# Patient Record
Sex: Female | Born: 1965 | Hispanic: No | Marital: Married | Smoking: Never smoker
Health system: Southern US, Community
[De-identification: ages and names within clinical notes are randomized; demographics above are authoritative.]

## PROBLEM LIST (undated history)

## (undated) HISTORY — PX: ABDOMINAL HYSTERECTOMY: SHX81

## (undated) HISTORY — PX: OTHER SURGICAL HISTORY: SHX169

---

## 2019-12-14 ENCOUNTER — Other Ambulatory Visit: Payer: Self-pay | Admitting: Obstetrics and Gynecology

## 2019-12-14 ENCOUNTER — Ambulatory Visit
Admission: RE | Admit: 2019-12-14 | Discharge: 2019-12-14 | Disposition: A | Discharge status: Home / Self Care | Payer: BC Managed Care – PPO | Source: Ambulatory Visit | Attending: Obstetrics and Gynecology | Admitting: Obstetrics and Gynecology

## 2019-12-14 DIAGNOSIS — Z1231 Encounter for screening mammogram for malignant neoplasm of breast: Secondary | ICD-10-CM

## 2020-01-20 ENCOUNTER — Other Ambulatory Visit: Payer: Self-pay | Admitting: Obstetrics and Gynecology

## 2020-02-02 ENCOUNTER — Other Ambulatory Visit: Payer: Self-pay | Admitting: Obstetrics and Gynecology

## 2020-02-02 DIAGNOSIS — R634 Abnormal weight loss: Secondary | ICD-10-CM

## 2020-02-02 DIAGNOSIS — N939 Abnormal uterine and vaginal bleeding, unspecified: Secondary | ICD-10-CM

## 2020-02-02 DIAGNOSIS — N924 Excessive bleeding in the premenopausal period: Secondary | ICD-10-CM

## 2020-02-02 DIAGNOSIS — R9389 Abnormal findings on diagnostic imaging of other specified body structures: Secondary | ICD-10-CM

## 2020-02-02 DIAGNOSIS — R1900 Intra-abdominal and pelvic swelling, mass and lump, unspecified site: Secondary | ICD-10-CM

## 2020-02-15 ENCOUNTER — Ambulatory Visit: Payer: BC Managed Care – PPO

## 2020-02-15 ENCOUNTER — Other Ambulatory Visit: Payer: Self-pay

## 2020-02-15 ENCOUNTER — Ambulatory Visit
Admission: RE | Admit: 2020-02-15 | Discharge: 2020-02-15 | Disposition: A | Discharge status: Home / Self Care | Payer: BC Managed Care – PPO | Source: Ambulatory Visit | Attending: Obstetrics and Gynecology | Admitting: Obstetrics and Gynecology

## 2020-02-15 DIAGNOSIS — R9389 Abnormal findings on diagnostic imaging of other specified body structures: Secondary | ICD-10-CM | POA: Diagnosis present

## 2020-02-15 DIAGNOSIS — R1900 Intra-abdominal and pelvic swelling, mass and lump, unspecified site: Secondary | ICD-10-CM | POA: Diagnosis present

## 2020-02-15 DIAGNOSIS — N924 Excessive bleeding in the premenopausal period: Secondary | ICD-10-CM

## 2020-02-15 DIAGNOSIS — R634 Abnormal weight loss: Secondary | ICD-10-CM | POA: Diagnosis present

## 2020-02-15 DIAGNOSIS — N939 Abnormal uterine and vaginal bleeding, unspecified: Secondary | ICD-10-CM | POA: Diagnosis present

## 2020-02-15 MED ORDER — GADOBUTROL 1 MMOL/ML IV SOLN
8.0000 mL | Freq: Once | INTRAVENOUS | Status: AC | PRN
  Administered 2020-02-15: 8 mL via INTRAVENOUS

## 2020-02-24 DIAGNOSIS — C801 Malignant (primary) neoplasm, unspecified: Secondary | ICD-10-CM

## 2020-02-24 HISTORY — DX: Malignant (primary) neoplasm, unspecified: C80.1

## 2020-10-17 HISTORY — PX: OTHER SURGICAL HISTORY: SHX169

## 2021-10-10 ENCOUNTER — Encounter: Payer: Self-pay | Admitting: Gastroenterology

## 2021-10-10 NOTE — H&P (Signed)
Pre-Procedure H&P   Patient ID: Bianca Park is a 56 y.o. female.  Gastroenterology Provider: [name], DO  Referring Provider: Dawson Bills, NP PCP: Benjaman Kindler, MD  Date: 10/11/2021  HPI Bianca Park is a 56 y.o. female who presents today for Colonoscopy for surveillance-rectal cancer.  Patient with history of rectal cancer diagnosed in 2021.  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  This was 30% circumferential was appreciated by surgery.  Initial colonoscopy prior attempted lifting cut with hot snare.  Piecemeal resection was performed, however, incomplete per notation.  This area was tattooed as well.   At that time CEA was 1.2.  Lymph nodes were negative on LAR performed in February 2022.  The patient also underwent systemic FOLFOX therapy.  She is also status post total hysterectomy.  Currently has loose bowel movements but overall doing well with fiber and antidiarrheal supplementation.  MRI without metastasis.  Her ostomies have since been closed.  Hemoglobin is 12.3 MCV 86 creatinine 0.5 platelets 240,000.  Past Medical History:  Diagnosis Date   Cancer ([place]) 02/2020   rectal cancer    Past Surgical History:  Procedure Laterality Date   ABDOMINAL HYSTERECTOMY     CESAREAN SECTION     laparoscopic abdominoperineal proctectomy complete with colostomy  10/17/2020    Family History No h/o GI disease or malignancy  Review of Systems  Constitutional:  Negative for activity change, appetite change, chills, diaphoresis, fatigue, fever and unexpected weight change.  HENT:  Negative for trouble swallowing and voice change.   Respiratory:  Negative for shortness of breath and wheezing.   Cardiovascular:  Negative for chest pain, palpitations and leg swelling.  Gastrointestinal:  Positive for diarrhea (Loose stool since surgery).  Negative for abdominal distention, abdominal pain, anal bleeding, blood in stool, constipation, nausea, rectal pain and vomiting.  Musculoskeletal:  Negative for arthralgias and myalgias.  Skin:  Negative for color change and pallor.  Neurological:  Negative for dizziness, syncope and weakness.  Psychiatric/Behavioral:  Negative for confusion.   All other systems reviewed and are negative.   Medications No current facility-administered medications on file prior to encounter.   Current Outpatient Medications on File Prior to Encounter  Medication Sig Dispense Refill   acetaminophen (TYLENOL) 500 MG tablet Take 500 mg by mouth every 6 (six) hours as needed.     Ascorbic Acid (VITAMIN C PO) Take by mouth every other day.     gabapentin (NEURONTIN) 100 MG capsule Take 100 mg by mouth 3 (three) times daily.      Pertinent medications related to GI and procedure were reviewed by me with the patient prior to the procedure   Current Facility-Administered Medications:    0.9 %  sodium chloride infusion, , Intravenous, Continuous, [name], DO, Last Rate: 20 mL/hr at 10/11/21 1026, New Bag at 10/11/21 1026      No Known Allergies Allergies were reviewed by me prior to the procedure  Objective    Vitals:   10/11/21 0956  BP: (!) 125/42  Pulse: 67  Resp: 16  Temp: (!) 96.4 F (35.8 C)  TempSrc: Temporal  SpO2: 100%  Weight: 83.9 kg  Height: 5\' 1"  (1.549 m)     Physical Exam Vitals and nursing note reviewed.  Constitutional:      General: She is not in acute distress.    Appearance: Normal appearance. She  is obese. She is not ill-appearing, toxic-appearing or diaphoretic.  HENT:     Head: Normocephalic and atraumatic.     Nose: Nose normal.     Mouth/Throat:     Mouth: Mucous membranes are moist.     Pharynx: Oropharynx is clear.  Eyes:     General: No scleral icterus.    Extraocular Movements: Extraocular movements intact.  Cardiovascular:     Rate and  Rhythm: Normal rate and regular rhythm.     Heart sounds: Normal heart sounds. No murmur heard.   No friction rub. No gallop.  Pulmonary:     Effort: Pulmonary effort is normal. No respiratory distress.     Breath sounds: Normal breath sounds. No wheezing, rhonchi or rales.  Abdominal:     General: Bowel sounds are normal. There is no distension.     Palpations: Abdomen is soft.     Tenderness: There is no abdominal tenderness. There is no guarding or rebound.     Comments: Previous ostomy scar CDI  Musculoskeletal:     Cervical back: Neck supple.     Right lower leg: No edema.     Left lower leg: No edema.     Comments: Port right chest wall  Skin:    General: Skin is warm and dry.     Coloration: Skin is not jaundiced or pale.  Neurological:     General: No focal deficit present.     Mental Status: She is alert and oriented to person, place, and time. Mental status is at baseline.  Psychiatric:        Mood and Affect: Mood normal.        Behavior: Behavior normal.        Thought Content: Thought content normal.        Judgment: Judgment normal.     Assessment:  Bianca Park is a 56 y.o. female  who presents today for Colonoscopy for surveillance-rectal cancer.  Plan:  Colonoscopy with possible intervention today  Colonoscopy with possible biopsy, control of bleeding, polypectomy, and interventions as necessary has been discussed with the patient/patient representative. Informed consent was obtained from the patient/patient representative after explaining the indication, nature, and risks of the procedure including but not limited to death, bleeding, perforation, missed neoplasm/lesions, cardiorespiratory compromise, and reaction to medications. Opportunity for questions was given and appropriate answers were provided. Patient/patient representative has verbalized understanding is amenable to undergoing the procedure.   [name], DO  [facility]  Gastroenterology  Portions of the record may have been created with voice recognition software. Occasional wrong-word or 'sound-a-like' substitutions may have occurred due to the inherent limitations of voice recognition software.  Read the chart carefully and recognize, using context, where substitutions may have occurred.

## 2021-10-11 ENCOUNTER — Encounter
Admission: RE | Disposition: A | Discharge status: Home / Self Care | Payer: Self-pay | Source: Home / Self Care | Attending: Gastroenterology

## 2021-10-11 ENCOUNTER — Ambulatory Visit: Payer: BC Managed Care – PPO | Admitting: Anesthesiology

## 2021-10-11 ENCOUNTER — Ambulatory Visit
Admission: RE | Admit: 2021-10-11 | Discharge: 2021-10-11 | Disposition: A | Discharge status: Home / Self Care | Payer: BC Managed Care – PPO | Attending: Gastroenterology | Admitting: Gastroenterology

## 2021-10-11 ENCOUNTER — Encounter: Payer: Self-pay | Admitting: Gastroenterology

## 2021-10-11 DIAGNOSIS — K635 Polyp of colon: Secondary | ICD-10-CM | POA: Insufficient documentation

## 2021-10-11 DIAGNOSIS — Z08 Encounter for follow-up examination after completed treatment for malignant neoplasm: Secondary | ICD-10-CM | POA: Diagnosis not present

## 2021-10-11 DIAGNOSIS — Z9049 Acquired absence of other specified parts of digestive tract: Secondary | ICD-10-CM | POA: Insufficient documentation

## 2021-10-11 DIAGNOSIS — Z85048 Personal history of other malignant neoplasm of rectum, rectosigmoid junction, and anus: Secondary | ICD-10-CM | POA: Diagnosis not present

## 2021-10-11 DIAGNOSIS — D124 Benign neoplasm of descending colon: Secondary | ICD-10-CM | POA: Diagnosis not present

## 2021-10-11 HISTORY — PX: COLONOSCOPY: SHX5424

## 2021-10-11 SURGERY — COLONOSCOPY
Anesthesia: General

## 2021-10-11 MED ORDER — PROPOFOL 10 MG/ML IV BOLUS
INTRAVENOUS | Status: AC
  Filled 2021-10-11: qty 20

## 2021-10-11 MED ORDER — PROPOFOL 10 MG/ML IV BOLUS
INTRAVENOUS | Status: DC | PRN
Start: 2021-10-11 — End: 2021-10-11
  Administered 2021-10-11: 10 mg via INTRAVENOUS
  Administered 2021-10-11: 20 mg via INTRAVENOUS
  Administered 2021-10-11 (×2): 10 mg via INTRAVENOUS
  Administered 2021-10-11: 20 mg via INTRAVENOUS
  Administered 2021-10-11: 10 mg via INTRAVENOUS
  Administered 2021-10-11 (×3): 20 mg via INTRAVENOUS
  Administered 2021-10-11: 100 mg via INTRAVENOUS
  Administered 2021-10-11 (×2): 20 mg via INTRAVENOUS
  Administered 2021-10-11 (×5): 10 mg via INTRAVENOUS
  Administered 2021-10-11: 20 mg via INTRAVENOUS

## 2021-10-11 MED ORDER — SODIUM CHLORIDE 0.9 % IV SOLN: INTRAVENOUS | Status: DC

## 2021-10-11 MED ORDER — HEPARIN SOD (PORK) LOCK FLUSH 100 UNIT/ML IV SOLN
INTRAVENOUS | Status: AC
  Filled 2021-10-11: qty 5

## 2021-10-11 MED ORDER — LIDOCAINE HCL (PF) 2 % IJ SOLN
INTRAMUSCULAR | Status: AC
  Filled 2021-10-11: qty 5

## 2021-10-11 MED ORDER — LIDOCAINE HCL (CARDIAC) PF 100 MG/5ML IV SOSY
PREFILLED_SYRINGE | INTRAVENOUS | Status: DC | PRN
Start: 2021-10-11 — End: 2021-10-11
  Administered 2021-10-11: 100 mg via INTRAVENOUS

## 2021-10-11 NOTE — Anesthesia Postprocedure Evaluation (Signed)
Anesthesia Post Note  Patient: Zeenat Jeanbaptiste  Procedure(s) Performed: COLONOSCOPY  Patient location during evaluation: Endoscopy Anesthesia Type: General Level of consciousness: awake and alert Pain management: pain level controlled Vital Signs Assessment: post-procedure vital signs reviewed and stable Respiratory status: spontaneous breathing, nonlabored ventilation, respiratory function stable and patient connected to nasal cannula oxygen Cardiovascular status: blood pressure returned to baseline and stable Postop Assessment: no apparent nausea or vomiting Anesthetic complications: no   No notable events documented.   Last Vitals:  Vitals:   10/11/21 0956 10/11/21 1139  BP: (!) 125/42 112/70  Pulse: 67 96  Resp: 16 18  Temp: (!) 35.8 C (!) 36.2 C  SpO2: 100% 98%    Last Pain:  Vitals:   10/11/21 1149  TempSrc:   PainSc: 0-No pain                 Precious Haws Jaylyne Breese

## 2021-10-11 NOTE — Transfer of Care (Signed)
Immediate Anesthesia Transfer of Care Note  Patient: Bianca Park  Procedure(s) Performed: COLONOSCOPY  Patient Location: PACU  Anesthesia Type:General  Level of Consciousness: awake  Airway & Oxygen Therapy: Patient Spontanous Breathing  Post-op Assessment: Report given to RN and Post -op Vital signs reviewed and stable  Post vital signs: Reviewed and stable  Last Vitals:  Vitals Value Taken Time  BP 112/70 10/11/21 1140  Temp 36.2 C 10/11/21 1139  Pulse 96 10/11/21 1140  Resp 18 10/11/21 1140  SpO2 97 % 10/11/21 1140  Vitals shown include unvalidated device data.  Last Pain:  Vitals:   10/11/21 1139  TempSrc: Temporal  PainSc: 0-No pain         Complications: No notable events documented.

## 2021-10-11 NOTE — Interval H&P Note (Signed)
History and Physical Interval Note: Preprocedure H&P from 10/11/21  was reviewed and there was no interval change after seeing and examining the patient.  Written consent was obtained from the patient after discussion of risks, benefits, and alternatives. Patient has consented to proceed with Colonoscopy with possible intervention   10/11/2021 11:00 AM  Bianca Park  has presented today for surgery, with the diagnosis of History of rectal cancer (Z85.048).  The various methods of treatment have been discussed with the patient and family. After consideration of risks, benefits and other options for treatment, the patient has consented to  Procedure(s): COLONOSCOPY (N/A) as a surgical intervention.  The patient's history has been reviewed, patient examined, no change in status, stable for surgery.  I have reviewed the patient's chart and labs.  Questions were answered to the patient's satisfaction.     [name]

## 2021-10-11 NOTE — Progress Notes (Signed)
Patients family updated on delay in room by Maye Hides

## 2021-10-11 NOTE — Anesthesia Preprocedure Evaluation (Signed)
Anesthesia Evaluation  Patient identified by MRN, date of birth, ID band Patient awake    Reviewed: Allergy & Precautions, NPO status , Patient's Chart, lab work & pertinent test results  History of Anesthesia Complications Negative for: history of anesthetic complications  Airway Mallampati: III  TM Distance: >3 FB Neck ROM: full    Dental  (+) Chipped   Pulmonary neg pulmonary ROS, neg shortness of breath,    Pulmonary exam normal        Cardiovascular Exercise Tolerance: Good (-) anginanegative cardio ROS Normal cardiovascular exam     Neuro/Psych negative neurological ROS  negative psych ROS   GI/Hepatic negative GI ROS, Neg liver ROS, neg GERD  ,  Endo/Other  negative endocrine ROS  Renal/GU negative Renal ROS  negative genitourinary   Musculoskeletal   Abdominal   Peds  Hematology negative hematology ROS (+)   Anesthesia Other Findings Past Medical History: 02/2020: Cancer ([place])     Comment:  rectal cancer  Past Surgical History: No date: ABDOMINAL HYSTERECTOMY No date: CESAREAN SECTION 10/17/2020: laparoscopic abdominoperineal proctectomy complete with  colostomy  BMI    Body Mass Index: 34.96 kg/m      Reproductive/Obstetrics negative OB ROS                             Anesthesia Physical Anesthesia Plan  ASA: 2  Anesthesia Plan: General   Post-op Pain Management:    Induction: Intravenous  PONV Risk Score and Plan: Propofol infusion and TIVA  Airway Management Planned: Natural Airway and Nasal Cannula  Additional Equipment:   Intra-op Plan:   Post-operative Plan:   Informed Consent: I have reviewed the patients History and Physical, chart, labs and discussed the procedure including the risks, benefits and alternatives for the proposed anesthesia with the patient or authorized representative who has indicated his/her understanding and acceptance.      Dental Advisory Given  Plan Discussed with: Anesthesiologist, CRNA and Surgeon  Anesthesia Plan Comments: (Patient consented for risks of anesthesia including but not limited to:  - adverse reactions to medications - risk of airway placement if required - damage to eyes, teeth, lips or other oral mucosa - nerve damage due to positioning  - sore throat or hoarseness - Damage to heart, brain, nerves, lungs, other parts of body or loss of life  Patient voiced understanding.)        Anesthesia Quick Evaluation

## 2021-10-11 NOTE — Op Note (Signed)
[facility] Gastroenterology Patient Name: Bianca Park Procedure Date: 10/11/2021 10:47 AM MRN: [id number] Account #: [id number] Date of Birth: 04-18-1966 Admit Type: Outpatient Age: 56 Room: [facility] ENDO ROOM 1 Gender: Female Note Status: Finalized Instrument Name: Colonoscope [id number] Procedure:             Colonoscopy Indications:           High risk colon cancer surveillance: Personal history                         of rectal cancer Providers:             [name] DO, DO Referring MD:          Angelina Pih (Referring MD) Medicines:             Monitored Anesthesia Care Complications:         No immediate complications. Estimated blood loss:                         Minimal. Procedure:             Pre-Anesthesia Assessment:                        - Prior to the procedure, a History and Physical was                         performed, and patient medications and allergies were                         reviewed. The patient is competent. The risks and                         benefits of the procedure and the sedation options and                         risks were discussed with the patient. All questions                         were answered and informed consent was obtained.                         Patient identification and proposed procedure were                         verified by the physician, the nurse, the anesthetist                         and the technician in the endoscopy suite. Mental                         Status Examination: alert and oriented. Airway                         Examination: normal oropharyngeal airway and neck                         mobility. Respiratory Examination: clear to  auscultation. CV Examination: RRR, no murmurs, no S3                         or S4. Prophylactic Antibiotics: The patient does not                         require prophylactic antibiotics. Prior                          Anticoagulants: The patient has taken no previous                         anticoagulant or antiplatelet agents. ASA Grade                         Assessment: II - A patient with mild systemic disease.                         After reviewing the risks and benefits, the patient                         was deemed in satisfactory condition to undergo the                         procedure. The anesthesia plan was to use monitored                         anesthesia care (MAC). Immediately prior to                         administration of medications, the patient was                         re-assessed for adequacy to receive sedatives. The                         heart rate, respiratory rate, oxygen saturations,                         blood pressure, adequacy of pulmonary ventilation, and                         response to care were monitored throughout the                         procedure. The physical status of the patient was                         re-assessed after the procedure.                        After obtaining informed consent, the colonoscope was                         passed under direct vision. Throughout the procedure,                         the patient's blood pressure, pulse, and oxygen  saturations were monitored continuously. The                         Colonoscope was introduced through the anus and                         advanced to the the terminal ileum, with                         identification of the appendiceal orifice and IC                         valve. The colonoscopy was performed without                         difficulty. The patient tolerated the procedure well.                         The quality of the bowel preparation was evaluated                         using the BBPS [facility] Bowel Preparation Scale) with                         scores of: Right Colon = 3, Transverse Colon = 3 and                         Left Colon = 3  (entire mucosa seen well with no                         residual staining, small fragments of stool or opaque                         liquid). The total BBPS score equals 9. The terminal                         ileum, ileocecal valve, appendiceal orifice, and                         rectum were photographed. Findings:      The perianal and digital rectal examinations were normal. Pertinent       negatives include normal sphincter tone.      Two sessile polyps were found in the descending colon. The polyps were 1       to 2 mm in size. These polyps were removed with a cold biopsy forceps.       Resection and retrieval were complete. Estimated blood loss was minimal.      Scattered small-mouthed diverticula were found in the entire colon.       Estimated blood loss: none.      There was evidence of a prior end-to-end colo-colonic anastomosis at       approximately 5 cm proximal to the anus. This was patent and was       characterized by healthy appearing mucosa and an intact staple line. The       anastomosis was traversed. Estimated blood loss: none.      The exam was otherwise without abnormality on direct and retroflexion  views. Impression:            - Two 1 to 2 mm polyps in the descending colon,                         removed with a cold biopsy forceps. Resected and                         retrieved.                        - Diverticulosis in the entire examined colon.                        - Patent end-to-end colo-colonic anastomosis,                         characterized by healthy appearing mucosa and an                         intact staple line.                        - The examination was otherwise normal on direct and                         retroflexion views. Recommendation:        - Discharge patient to home.                        - Resume previous diet.                        - Continue present medications.                        - Await pathology results.                         - Repeat colonoscopy in 2 years for surveillance based                         on personal history of colon cancer.                        - Return to GI office as previously scheduled.                        - The findings and recommendations were discussed with                         the patient. Procedure Code(s):     --- Professional ---                        [phone], Colonoscopy, flexible; with biopsy, single or                         multiple Diagnosis Code(s):     --- Professional ---                        P59.163, Personal history of  other malignant neoplasm                         of rectum, rectosigmoid junction, and anus                        K63.5, Polyp of colon                        Z98.0, Intestinal bypass and anastomosis status                        K57.30, Diverticulosis of large intestine without                         perforation or abscess without bleeding CPT copyright 2019 American Medical Association. All rights reserved. The codes documented in this report are preliminary and upon coder review may  be revised to meet current compliance requirements. Attending Participation:      I personally performed the entire procedure. Volney American, DO [name] DO, DO 10/11/2021 11:43:59 AM This report has been signed electronically. Number of Addenda: 0 Note Initiated On: 10/11/2021 10:47 AM Scope Withdrawal Time: 0 hours 21 minutes 43 seconds  Total Procedure Duration: 0 hours 28 minutes 44 seconds  Estimated Blood Loss:  Estimated blood loss was minimal.      [facility]

## 2021-10-12 ENCOUNTER — Encounter: Payer: Self-pay | Admitting: Gastroenterology

## 2021-10-12 LAB — SURGICAL PATHOLOGY

## 2021-11-01 ENCOUNTER — Other Ambulatory Visit: Payer: Self-pay | Admitting: Obstetrics and Gynecology

## 2021-11-01 DIAGNOSIS — Z1231 Encounter for screening mammogram for malignant neoplasm of breast: Secondary | ICD-10-CM

## 2021-12-27 ENCOUNTER — Ambulatory Visit
Admission: RE | Admit: 2021-12-27 | Discharge: 2021-12-27 | Disposition: A | Discharge status: Home / Self Care | Payer: BC Managed Care – PPO | Source: Ambulatory Visit | Attending: Obstetrics and Gynecology | Admitting: Obstetrics and Gynecology

## 2021-12-27 DIAGNOSIS — Z1231 Encounter for screening mammogram for malignant neoplasm of breast: Secondary | ICD-10-CM | POA: Insufficient documentation

## 2022-06-25 ENCOUNTER — Encounter: Payer: Self-pay | Admitting: Emergency Medicine

## 2022-06-25 ENCOUNTER — Other Ambulatory Visit: Payer: Self-pay

## 2022-06-25 ENCOUNTER — Emergency Department
Admission: EM | Admit: 2022-06-25 | Discharge: 2022-06-25 | Disposition: A | Discharge status: Home / Self Care | Payer: BC Managed Care – PPO | Attending: Emergency Medicine | Admitting: Emergency Medicine

## 2022-06-25 ENCOUNTER — Emergency Department: Payer: BC Managed Care – PPO

## 2022-06-25 DIAGNOSIS — M5442 Lumbago with sciatica, left side: Secondary | ICD-10-CM | POA: Diagnosis not present

## 2022-06-25 DIAGNOSIS — G8929 Other chronic pain: Secondary | ICD-10-CM | POA: Insufficient documentation

## 2022-06-25 DIAGNOSIS — M545 Low back pain, unspecified: Secondary | ICD-10-CM | POA: Diagnosis present

## 2022-06-25 MED ORDER — ONDANSETRON HCL 4 MG/2ML IJ SOLN: 4.0000 mg | Freq: Once | INTRAMUSCULAR | Status: DC

## 2022-06-25 MED ORDER — MORPHINE SULFATE (PF) 4 MG/ML IV SOLN
4.0000 mg | Freq: Once | INTRAVENOUS | Status: AC
  Administered 2022-06-25: 4 mg via INTRAMUSCULAR
  Filled 2022-06-25: qty 1

## 2022-06-25 MED ORDER — OXYCODONE HCL 5 MG PO TABS: 5.0000 mg | ORAL_TABLET | Freq: Three times a day (TID) | ORAL | 0 refills | Status: AC | PRN

## 2022-06-25 MED ORDER — ONDANSETRON 4 MG PO TBDP
4.0000 mg | ORAL_TABLET | Freq: Once | ORAL | Status: AC
  Administered 2022-06-25: 4 mg via ORAL
  Filled 2022-06-25: qty 1

## 2022-06-25 MED ORDER — KETOROLAC TROMETHAMINE 15 MG/ML IJ SOLN
15.0000 mg | Freq: Once | INTRAMUSCULAR | Status: AC
  Administered 2022-06-25: 15 mg via INTRAMUSCULAR
  Filled 2022-06-25: qty 1

## 2022-06-25 MED ORDER — KETOROLAC TROMETHAMINE 30 MG/ML IJ SOLN: 30.0000 mg | Freq: Once | INTRAMUSCULAR | Status: DC

## 2022-06-25 MED ORDER — MORPHINE SULFATE (PF) 4 MG/ML IV SOLN
4.0000 mg | Freq: Once | INTRAVENOUS | Status: DC
Start: 2022-06-25 — End: 2022-06-25

## 2022-06-25 NOTE — ED Triage Notes (Signed)
Presents with lower back pain with radiates into left leg  States pain started about 6 weeks ago  Denies any injury

## 2022-06-25 NOTE — ED Provider Notes (Signed)
[facility] Provider Note    Event Date/Time   First MD Initiated Contact with Patient 06/25/22 1435     (approximate)   History   Back Pain (/)   HPI  Bianca Park is a 56 y.o. female who presents with complaints of left lower back pain.  Patient reports greater than 6 weeks of back pain, it is steadily worsened, she reports that when she stands her left leg feels like it is on fire and she has tingling and numbness.  She has tried 2 courses of steroids by her PCP with no significant improvement.  Had MRI scheduled but the pain is too severe.  No loss of continence.  No fevers     Physical Exam   Triage Vital Signs: ED Triage Vitals  Enc Vitals Group     BP 06/25/22 1431 (!) 112/40     Pulse Rate 06/25/22 1431 66     Resp 06/25/22 1431 18     Temp 06/25/22 1431 (!) 97.4 F (36.3 C)     Temp Source 06/25/22 1431 Oral     SpO2 06/25/22 1431 96 %     Weight 06/25/22 1431 83.9 kg (185 lb)     Height 06/25/22 1431 1.549 m ('5\' 1"'$ )     Head Circumference --      Peak Flow --      Pain Score 06/25/22 1431 9     Pain Loc --      Pain Edu? --      Excl. in [place]? --     Most recent vital signs: Vitals:   06/25/22 1431  BP: (!) 112/40  Pulse: 66  Resp: 18  Temp: (!) 97.4 F (36.3 C)  SpO2: 96%     General: Awake, no distress.  Lying prone on stretcher CV:  Good peripheral perfusion.  Resp:  Normal effort.  Abd:  No distention.  Other:  Back: No vertebral tenderness to palpation, strength appears normal in the lower extremities, no saddle anesthesia   ED Results / Procedures / Treatments   Labs (all labs ordered are listed, but only abnormal results are displayed) Labs Reviewed - No data to display   EKG     RADIOLOGY MRI lumbar spine ordered    PROCEDURES:  Critical Care performed:   Procedures   MEDICATIONS ORDERED IN ED: Medications  morphine (PF) 4 MG/ML injection 4 mg (has no administration in time range)   ondansetron (ZOFRAN) injection 4 mg (has no administration in time range)  ketorolac (TORADOL) 30 MG/ML injection 30 mg (has no administration in time range)     IMPRESSION / MDM / ASSESSMENT AND PLAN / ED COURSE  I reviewed the triage vital signs and the nursing notes. Patient's presentation is most consistent with acute presentation with potential threat to life or bodily function.   Patient presents with low back pain as noted above.  Differential includes sciatica/radiculopathy, slipped disc, doubt infection  We will give the patient IV morphine, IV Zofran, IV Toradol.  We will obtain the MRI of the lumbar spine here  I have asked my colleague to follow-up on MRI results, anticipate outpatient follow-up with adequate pain control.       FINAL CLINICAL IMPRESSION(S) / ED DIAGNOSES   Final diagnoses:  Chronic left-sided low back pain with left-sided sciatica     Rx / DC Orders   ED Discharge Orders     None        Note:  This document was prepared using Dragon voice recognition software and may include unintentional dictation errors.   Lavonia Drafts, MD 06/25/22 [phone]

## 2022-06-25 NOTE — Discharge Instructions (Addendum)
Your MRI showed degenerative changes in your spine but no significant spinal canal stenosis.  Please continue to take Tylenol and ibuprofen in addition to this you can take the oxycodone as needed for breakthrough pain.  Please stop taking the tramadol if you are going to take the oxycodone.  Please do not drive when you are taking the oxycodone.  Please follow-up with [facility].

## 2022-06-25 NOTE — ED Provider Notes (Signed)
Patient signed out to me pending MRI L-spine.  56 year old female presenting with about 6 weeks of low back pain radicular symptoms.  MRI shows degenerative changes but no spinal canal stenosis or significant neuroforaminal stenosis.  Will prescribe short course of oxycodone.  Patient has follow-up with [facility].   Rada Hay, MD 06/25/22 [phone]

## 2022-12-23 ENCOUNTER — Other Ambulatory Visit: Payer: Self-pay | Admitting: Obstetrics and Gynecology

## 2022-12-23 DIAGNOSIS — Z1231 Encounter for screening mammogram for malignant neoplasm of breast: Secondary | ICD-10-CM

## 2022-12-27 ENCOUNTER — Other Ambulatory Visit: Payer: Self-pay

## 2022-12-27 DIAGNOSIS — C20 Malignant neoplasm of rectum: Secondary | ICD-10-CM

## 2023-01-01 ENCOUNTER — Other Ambulatory Visit: Payer: Self-pay

## 2023-01-01 DIAGNOSIS — C20 Malignant neoplasm of rectum: Secondary | ICD-10-CM

## 2023-01-15 ENCOUNTER — Ambulatory Visit
Admission: RE | Admit: 2023-01-15 | Discharge: 2023-01-15 | Disposition: A | Discharge status: Home / Self Care | Payer: BC Managed Care – PPO | Source: Ambulatory Visit | Attending: Obstetrics and Gynecology | Admitting: Obstetrics and Gynecology

## 2023-01-15 DIAGNOSIS — Z1231 Encounter for screening mammogram for malignant neoplasm of breast: Secondary | ICD-10-CM | POA: Insufficient documentation

## 2023-01-16 IMAGING — MG MM DIGITAL SCREENING BILAT W/ TOMO AND CAD
6 of 10 series · 6 of 30 positions shown · non-contrast
Comparison: Previous exam(s).

CLINICAL DATA: Screening.

EXAM:
DIGITAL SCREENING BILATERAL MAMMOGRAM WITH TOMOSYNTHESIS AND CAD
TECHNIQUE: Bilateral screening digital craniocaudal and mediolateral oblique
mammograms were obtained. Bilateral screening digital breast
tomosynthesis was performed. The images were evaluated with
computer-aided detection.

[L CC synth-2D]
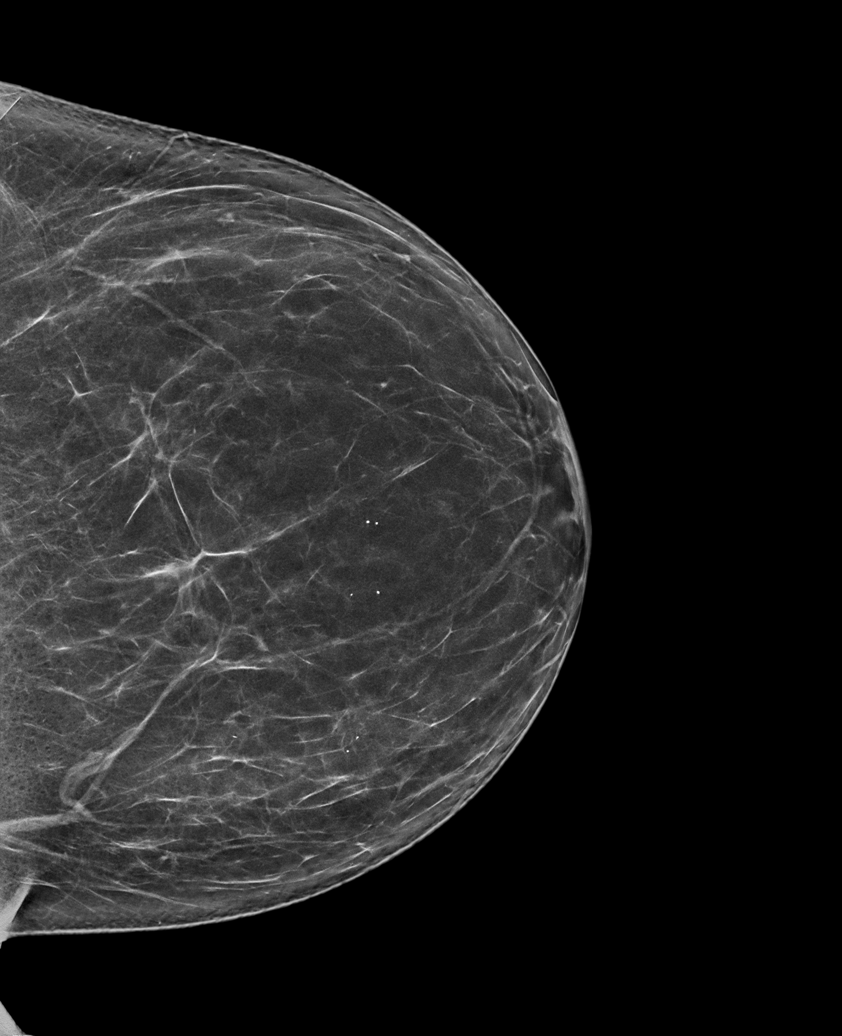

[L MLO synth-2D (1 of 2)]
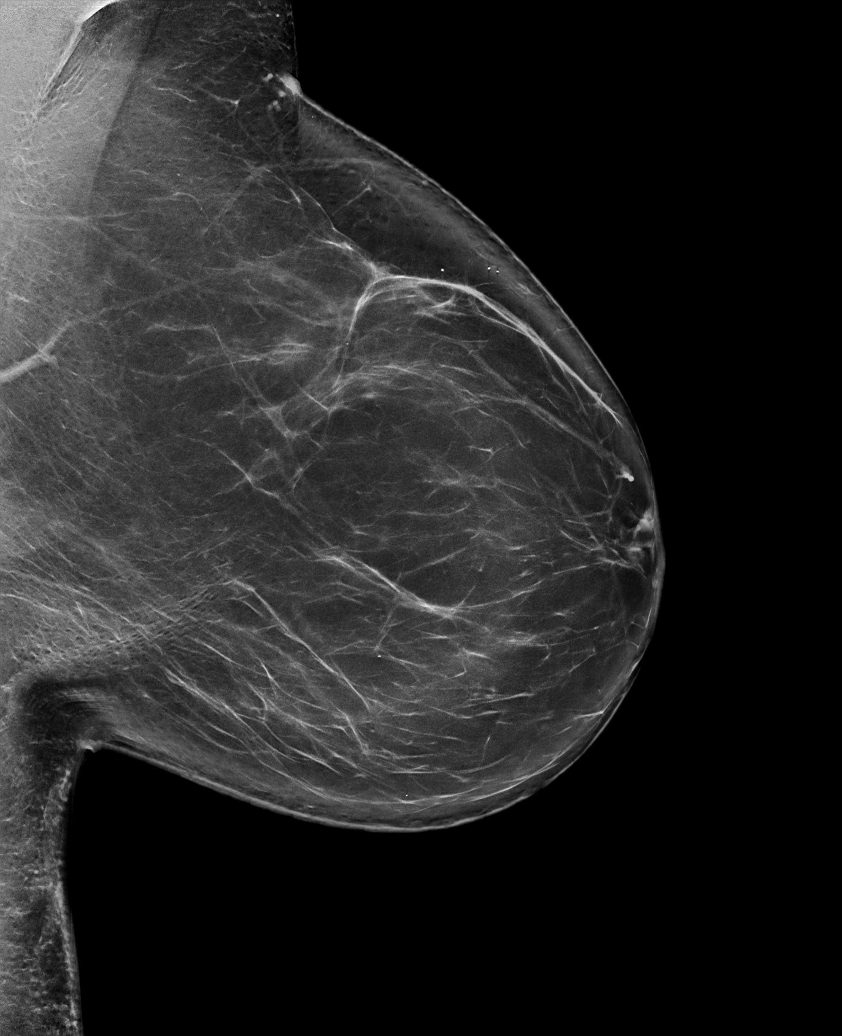

[L MLO synth-2D (2 of 2)]
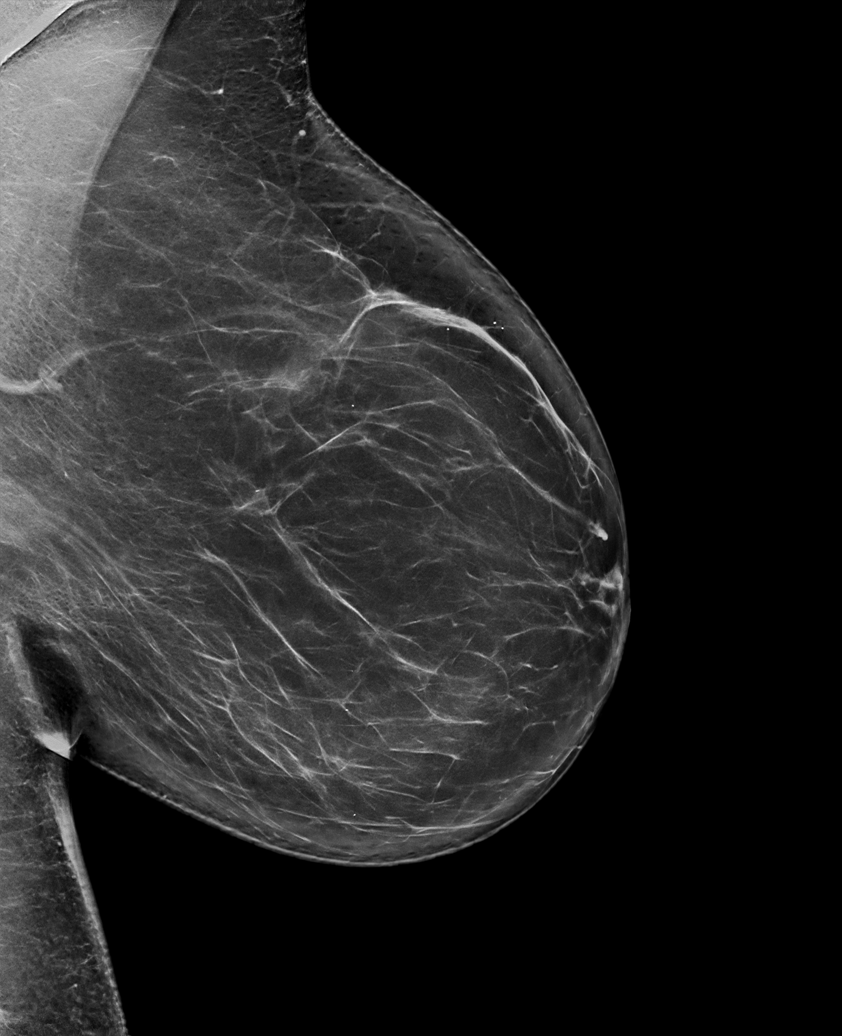

[R MLO synth-2D]
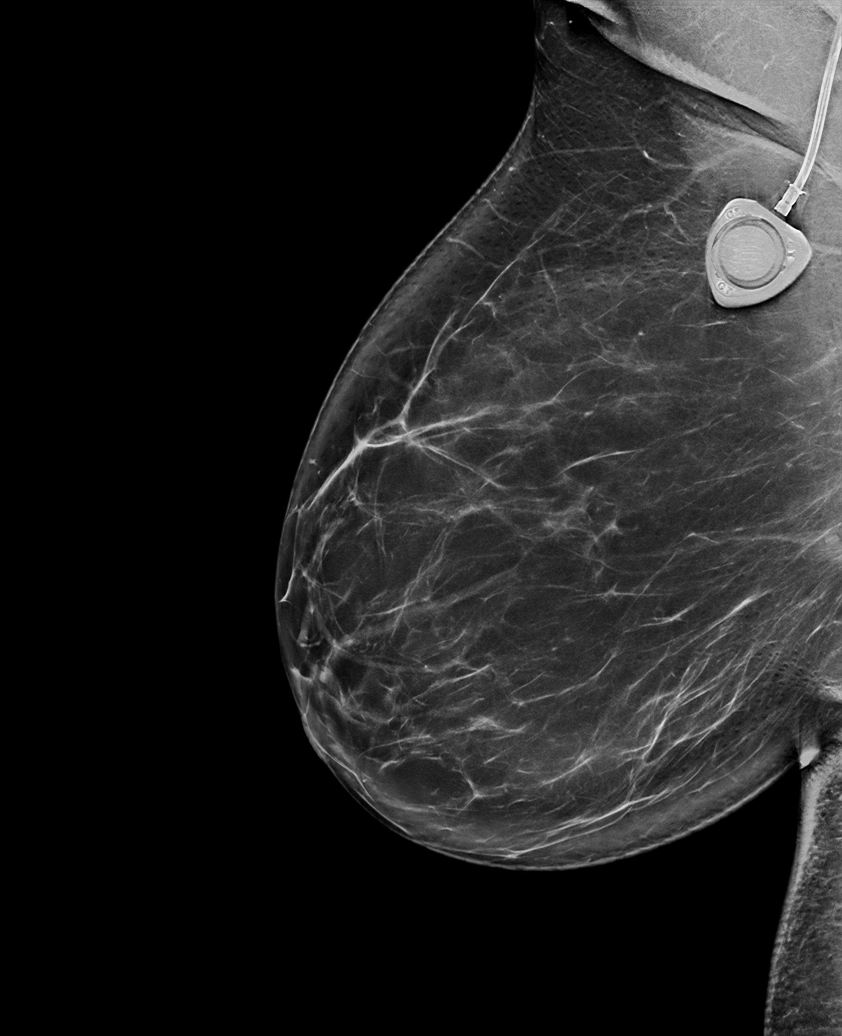

[R CC synth-2D]
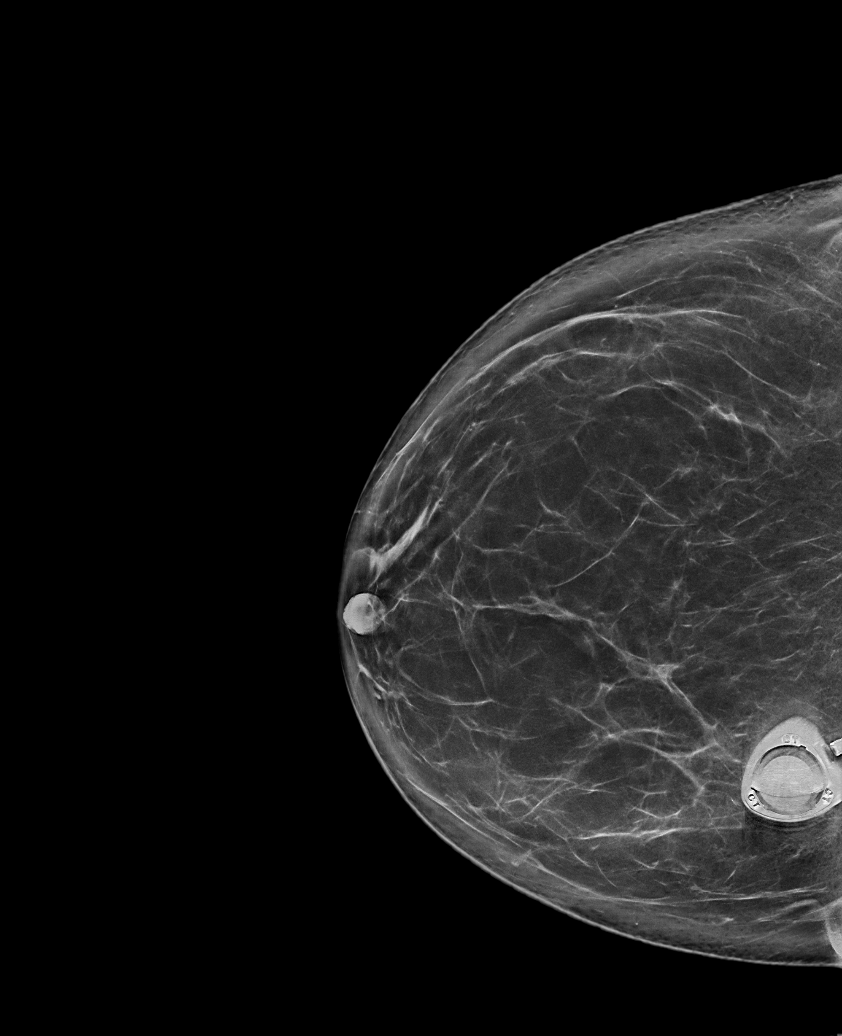

[L MLO tomo · tomo slice 52/103.0]
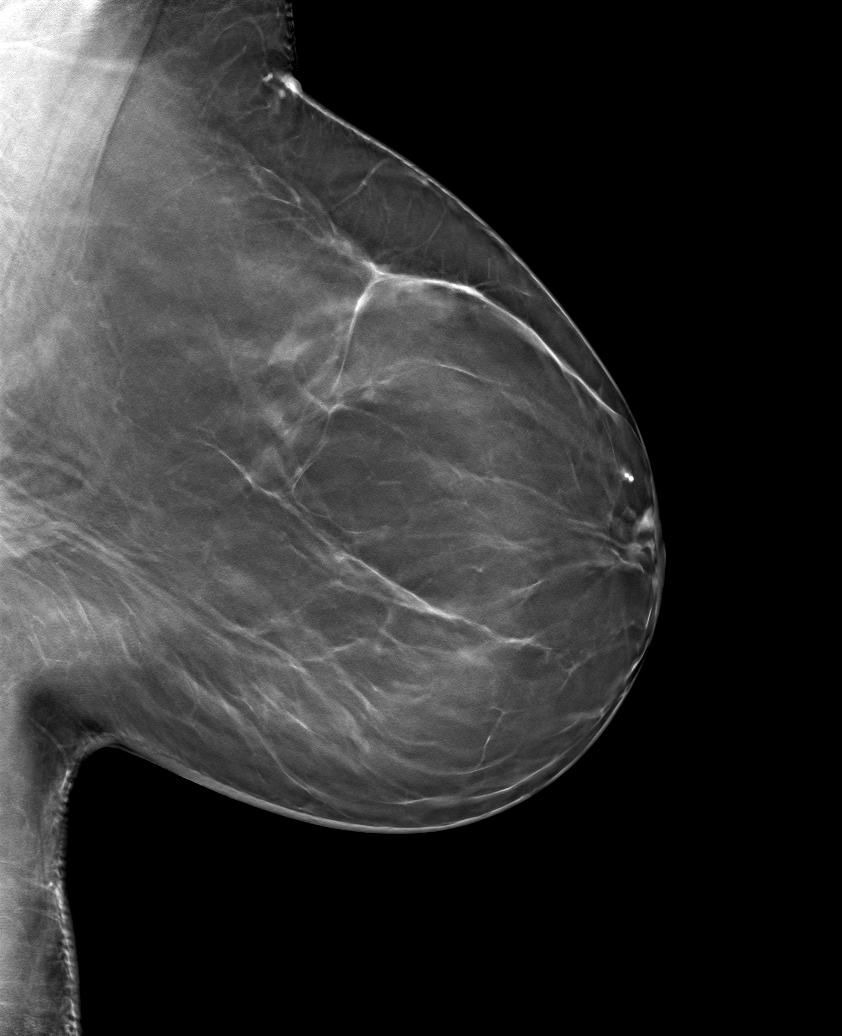

[6 of 30 positions shown; findings below may reference images not displayed]

ACR Breast Density Category b: There are scattered areas of
fibroglandular density.
FINDINGS: There are no findings suspicious for malignancy.
IMPRESSION: No mammographic evidence of malignancy. A result letter of this
screening mammogram will be mailed directly to the patient.

RECOMMENDATION:
Screening mammogram in one year. (Code:51-O-LD2)

BI-RADS CATEGORY  1: Negative.

## 2023-01-23 ENCOUNTER — Other Ambulatory Visit: Payer: Self-pay

## 2023-01-23 DIAGNOSIS — C20 Malignant neoplasm of rectum: Secondary | ICD-10-CM

## 2023-01-23 DIAGNOSIS — Z1239 Encounter for other screening for malignant neoplasm of breast: Secondary | ICD-10-CM

## 2023-03-12 ENCOUNTER — Other Ambulatory Visit: Payer: BC Managed Care – PPO

## 2023-03-13 ENCOUNTER — Other Ambulatory Visit: Payer: BC Managed Care – PPO

## 2023-05-05 ENCOUNTER — Other Ambulatory Visit: Payer: BC Managed Care – PPO

## 2023-09-10 ENCOUNTER — Ambulatory Visit: Payer: Self-pay | Admitting: Podiatry

## 2023-10-29 NOTE — H&P (Incomplete)
 Pre-Procedure H&P   Patient ID: Bianca Park is a 58 y.o. female.  Gastroenterology Provider: Jaynie Collins, DO  Referring Provider: Fransico Setters, NP PCP: Christeen Douglas, MD  Date: 10/30/2023  HPI Ms. Bianca Park is a 58 y.o. female who presents today for Colonoscopy for Personal history of rectal cancer .  Patient with a history of rectal cancer that was diagnosed in July 2021.  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  She underwent radiation followed by systemic chemotherapy and LAR.  She also underwent a total hysterectomy.  Most recent CEA was 1.6 Most recent colonoscopy was in February 2023 with 1 adenomatous polyp in the descending colon and pandiverticulosis  Creatinine 0.6 celiac negative hemoglobin 13.5 MCV 88 platelets 252,000  Past Medical History:  Diagnosis Date   Cancer (HCC) 02/2020   rectal cancer    Past Surgical History:  Procedure Laterality Date   ABDOMINAL HYSTERECTOMY     CESAREAN SECTION     COLONOSCOPY N/A 10/11/2021   Procedure: COLONOSCOPY;  Surgeon: Jaynie Collins, DO;  Location: [facility] ENDOSCOPY;  Service: Gastroenterology;  Laterality: N/A;   laparoscopic abdominoperineal proctectomy complete with colostomy  10/17/2020   reversal of ileostomy     2023    Family History No h/o GI disease or malignancy  Review of Systems  Constitutional:  Negative for activity change, appetite change, chills, diaphoresis, fatigue, fever and unexpected weight change.  HENT:  Negative for trouble swallowing and voice change.   Respiratory:  Negative for shortness of breath and wheezing.   Cardiovascular:  Negative for chest pain, palpitations and leg swelling.  Gastrointestinal:  Negative for abdominal distention, abdominal pain, anal bleeding, blood in stool, constipation, diarrhea, nausea, rectal pain and vomiting.  Musculoskeletal:  Negative for arthralgias and myalgias.  Skin:  Negative for  color change and pallor.  Neurological:  Negative for dizziness, syncope and weakness.  Psychiatric/Behavioral:  Negative for confusion.   All other systems reviewed and are negative.    Medications No current facility-administered medications on file prior to encounter.   Current Outpatient Medications on File Prior to Encounter  Medication Sig Dispense Refill   acetaminophen (TYLENOL) 500 MG tablet Take 500 mg by mouth every 6 (six) hours as needed.     Ascorbic Acid (VITAMIN C PO) Take by mouth every other day.     gabapentin (NEURONTIN) 100 MG capsule Take 100 mg by mouth 3 (three) times daily.     traMADol (ULTRAM) 50 MG tablet Take 50 mg by mouth every 6 (six) hours as needed.      Pertinent medications related to GI and procedure were reviewed by me with the patient prior to the procedure   Current Facility-Administered Medications:    0.9 %  sodium chloride infusion, , Intravenous, Continuous, Jaynie Collins, DO, Last Rate: 20 mL/hr at 10/30/23 1235, Continued from Pre-op at 10/30/23 1235  sodium chloride 20 mL/hr at 10/30/23 1235       No Known Allergies Allergies were reviewed by me prior to the procedure  Objective   Body mass index is 37.22 kg/m. Vitals:   10/30/23 1215  BP: (!) 145/86  Pulse: 83  Resp: 18  Temp: (!) 97.3 F (36.3 C)  TempSrc: Temporal  SpO2: 100%  Weight: 89.4 kg  Height: 5\' 1"  (1.549 m)     Physical Exam Vitals and nursing note reviewed.  Constitutional:      General: She is not in acute distress.  Appearance: Normal appearance. She is obese. She is not ill-appearing, toxic-appearing or diaphoretic.  HENT:     Head: Normocephalic and atraumatic.     Nose: Nose normal.     Mouth/Throat:     Mouth: Mucous membranes are moist.     Pharynx: Oropharynx is clear.  Eyes:     General: No scleral icterus.    Extraocular Movements: Extraocular movements intact.  Cardiovascular:     Rate and Rhythm: Normal rate and regular  rhythm.     Heart sounds: Normal heart sounds. No murmur heard.    No friction rub. No gallop.  Pulmonary:     Effort: Pulmonary effort is normal. No respiratory distress.     Breath sounds: Normal breath sounds. No wheezing, rhonchi or rales.  Abdominal:     General: Bowel sounds are normal. There is no distension.     Palpations: Abdomen is soft.     Tenderness: There is no abdominal tenderness. There is no guarding or rebound.  Musculoskeletal:     Cervical back: Neck supple.     Right lower leg: No edema.     Left lower leg: No edema.  Skin:    General: Skin is warm and dry.     Coloration: Skin is not jaundiced or pale.  Neurological:     General: No focal deficit present.     Mental Status: She is alert and oriented to person, place, and time. Mental status is at baseline.  Psychiatric:        Mood and Affect: Mood normal.        Behavior: Behavior normal.        Thought Content: Thought content normal.        Judgment: Judgment normal.      Assessment:  Ms. Bianca Park is a 58 y.o. female  who presents today for Colonoscopy for Personal history of rectal cancer .  Plan:  Colonoscopy with possible intervention today  Colonoscopy with possible biopsy, control of bleeding, polypectomy, and interventions as necessary has been discussed with the patient/patient representative. Informed consent was obtained from the patient/patient representative after explaining the indication, nature, and risks of the procedure including but not limited to death, bleeding, perforation, missed neoplasm/lesions, cardiorespiratory compromise, and reaction to medications. Opportunity for questions was given and appropriate answers were provided. Patient/patient representative has verbalized understanding is amenable to undergoing the procedure.   Jaynie Collins, DO  [facility] Gastroenterology  Portions of the record may have been created with voice recognition software.  Occasional wrong-word or 'sound-a-like' substitutions may have occurred due to the inherent limitations of voice recognition software.  Read the chart carefully and recognize, using context, where substitutions may have occurred.

## 2023-10-30 ENCOUNTER — Ambulatory Visit: Admitting: Registered Nurse

## 2023-10-30 ENCOUNTER — Encounter: Payer: Self-pay | Admitting: Gastroenterology

## 2023-10-30 ENCOUNTER — Ambulatory Visit
Admission: RE | Admit: 2023-10-30 | Discharge: 2023-10-30 | Disposition: A | Discharge status: Home / Self Care | Payer: BC Managed Care – PPO | Attending: Gastroenterology | Admitting: Gastroenterology

## 2023-10-30 ENCOUNTER — Encounter
Admission: RE | Disposition: A | Discharge status: Home / Self Care | Payer: Self-pay | Source: Home / Self Care | Attending: Gastroenterology

## 2023-10-30 DIAGNOSIS — Z90710 Acquired absence of both cervix and uterus: Secondary | ICD-10-CM | POA: Diagnosis not present

## 2023-10-30 DIAGNOSIS — Z980 Intestinal bypass and anastomosis status: Secondary | ICD-10-CM | POA: Insufficient documentation

## 2023-10-30 DIAGNOSIS — Z08 Encounter for follow-up examination after completed treatment for malignant neoplasm: Secondary | ICD-10-CM | POA: Insufficient documentation

## 2023-10-30 DIAGNOSIS — K635 Polyp of colon: Secondary | ICD-10-CM | POA: Diagnosis not present

## 2023-10-30 DIAGNOSIS — Z85048 Personal history of other malignant neoplasm of rectum, rectosigmoid junction, and anus: Secondary | ICD-10-CM | POA: Insufficient documentation

## 2023-10-30 DIAGNOSIS — Z923 Personal history of irradiation: Secondary | ICD-10-CM | POA: Diagnosis not present

## 2023-10-30 DIAGNOSIS — Z9889 Other specified postprocedural states: Secondary | ICD-10-CM | POA: Diagnosis not present

## 2023-10-30 DIAGNOSIS — K5730 Diverticulosis of large intestine without perforation or abscess without bleeding: Secondary | ICD-10-CM | POA: Diagnosis not present

## 2023-10-30 HISTORY — PX: POLYPECTOMY: SHX5525

## 2023-10-30 HISTORY — PX: COLONOSCOPY WITH PROPOFOL: SHX5780

## 2023-10-30 SURGERY — COLONOSCOPY WITH PROPOFOL
Anesthesia: General

## 2023-10-30 MED ORDER — SODIUM CHLORIDE 0.9 % IV SOLN: INTRAVENOUS | Status: DC

## 2023-10-30 MED ORDER — LIDOCAINE HCL (PF) 2 % IJ SOLN
INTRAMUSCULAR | Status: AC
Start: 2023-10-30 — End: ?
  Filled 2023-10-30: qty 5

## 2023-10-30 MED ORDER — PROPOFOL 10 MG/ML IV BOLUS
INTRAVENOUS | Status: DC | PRN
  Administered 2023-10-30: 80 mg via INTRAVENOUS

## 2023-10-30 MED ORDER — PROPOFOL 1000 MG/100ML IV EMUL
INTRAVENOUS | Status: AC
  Filled 2023-10-30: qty 100

## 2023-10-30 MED ORDER — LIDOCAINE HCL (CARDIAC) PF 100 MG/5ML IV SOSY
PREFILLED_SYRINGE | INTRAVENOUS | Status: DC | PRN
  Administered 2023-10-30: 50 mg via INTRAVENOUS

## 2023-10-30 MED ORDER — PROPOFOL 500 MG/50ML IV EMUL
INTRAVENOUS | Status: DC | PRN
  Administered 2023-10-30: 100 ug/kg/min via INTRAVENOUS

## 2023-10-30 MED ORDER — PROPOFOL 10 MG/ML IV BOLUS
INTRAVENOUS | Status: AC
  Filled 2023-10-30: qty 20

## 2023-10-30 NOTE — Op Note (Signed)
 [facility] Gastroenterology Patient Name: Bianca Park Procedure Date: 10/30/2023 11:50 AM MRN: [id number] Account #: [id number] Date of Birth: 11-14-1965 Admit Type: Outpatient Age: 58 Room: [facility] ENDO ROOM 1 Gender: Female Note Status: Finalized Instrument Name: Peds Colonoscope [id number] Procedure:             Colonoscopy Indications:           High risk colon cancer surveillance: Personal history                         of colon cancer Providers:             Jaynie Collins DO, DO Medicines:             Monitored Anesthesia Care Complications:         No immediate complications. Estimated blood loss:                         Minimal. Procedure:             Pre-Anesthesia Assessment:                        - Prior to the procedure, a History and Physical was                         performed, and patient medications and allergies were                         reviewed. The patient is competent. The risks and                         benefits of the procedure and the sedation options and                         risks were discussed with the patient. All questions                         were answered and informed consent was obtained.                         Patient identification and proposed procedure were                         verified by the physician, the nurse, the anesthetist                         and the technician in the endoscopy suite. Mental                         Status Examination: alert and oriented. Airway                         Examination: normal oropharyngeal airway and neck                         mobility. Respiratory Examination: clear to                         auscultation. CV Examination: RRR, no murmurs, no S3  or S4. Prophylactic Antibiotics: The patient does not                         require prophylactic antibiotics. Prior                         Anticoagulants: The patient has taken no anticoagulant                          or antiplatelet agents. ASA Grade Assessment: II - A                         patient with mild systemic disease. After reviewing                         the risks and benefits, the patient was deemed in                         satisfactory condition to undergo the procedure. The                         anesthesia plan was to use monitored anesthesia care                         (MAC). Immediately prior to administration of                         medications, the patient was re-assessed for adequacy                         to receive sedatives. The heart rate, respiratory                         rate, oxygen saturations, blood pressure, adequacy of                         pulmonary ventilation, and response to care were                         monitored throughout the procedure. The physical                         status of the patient was re-assessed after the                         procedure.                        After obtaining informed consent, the colonoscope was                         passed under direct vision. Throughout the procedure,                         the patient's blood pressure, pulse, and oxygen                         saturations were monitored continuously. The  Colonoscope was introduced through the anus and                         advanced to the the terminal ileum, with                         identification of the appendiceal orifice and IC                         valve. The colonoscopy was performed without                         difficulty. The patient tolerated the procedure well.                         The quality of the bowel preparation was evaluated                         using the BBPS [facility] Bowel Preparation Scale) with                         scores of: Right Colon = 3, Transverse Colon = 3 and                         Left Colon = 3 (entire mucosa seen well with no                         residual  staining, small fragments of stool or opaque                         liquid). The total BBPS score equals 9. The terminal                         ileum, ileocecal valve, appendiceal orifice, and                         rectum were photographed. Findings:      The perianal and digital rectal examinations were normal. Pertinent       negatives include normal sphincter tone.      The terminal ileum appeared normal. Estimated blood loss: none.      Retroflexion in the right colon was performed.      Two sessile polyps were found in the descending colon and cecum. The       polyps were 1 to 2 mm in size. These polyps were removed with a jumbo       cold forceps. Resection and retrieval were complete. Estimated blood       loss was minimal.      Scattered small-mouthed diverticula were found in the entire colon.       Estimated blood loss: none.      There was evidence of a prior end-to-end colo-colonic anastomosis in the       rectum. This was patent and was characterized by healthy appearing       mucosa. The anastomosis was traversed. Anastamosis ~5cm from anal verge.       Estimated blood loss: none.      The exam was otherwise without abnormality on direct and retroflexion  views. Impression:            - The examined portion of the ileum was normal.                        - Two 1 to 2 mm polyps in the descending colon and in                         the cecum, removed with a jumbo cold forceps. Resected                         and retrieved.                        - Diverticulosis in the entire examined colon.                        - Patent end-to-end colo-colonic anastomosis,                         characterized by healthy appearing mucosa.                        - The examination was otherwise normal on direct and                         retroflexion views. Recommendation:        - Patient has a contact number available for                         emergencies. The signs and  symptoms of potential                         delayed complications were discussed with the patient.                         Return to normal activities tomorrow. Written                         discharge instructions were provided to the patient.                        - Discharge patient to home.                        - Resume previous diet.                        - Continue present medications.                        - Await pathology results.                        - Repeat colonoscopy in 3 years for surveillance based                         on pathology results.                        - Return to referring physician as previously  scheduled.                        - The findings and recommendations were discussed with                         the patient. Procedure Code(s):     --- Professional ---                        [phone], Colonoscopy, flexible; with biopsy, single or                         multiple Diagnosis Code(s):     --- Professional ---                        Z85.038, Personal history of other malignant neoplasm                         of large intestine                        D12.4, Benign neoplasm of descending colon                        D12.0, Benign neoplasm of cecum                        Z98.0, Intestinal bypass and anastomosis status                        K57.30, Diverticulosis of large intestine without                         perforation or abscess without bleeding CPT copyright 2022 American Medical Association. All rights reserved. The codes documented in this report are preliminary and upon coder review may  be revised to meet current compliance requirements. Attending Participation:      I personally performed the entire procedure. Elfredia Nevins, DO Jaynie Collins DO, DO 10/30/2023 1:05:52 PM This report has been signed electronically. Number of Addenda: 0 Note Initiated On: 10/30/2023 11:50 AM Scope Withdrawal Time: 0  hours 12 minutes 7 seconds  Total Procedure Duration: 0 hours 16 minutes 10 seconds  Estimated Blood Loss:  Estimated blood loss was minimal.      [facility]

## 2023-10-30 NOTE — Anesthesia Postprocedure Evaluation (Signed)
 Anesthesia Post Note  Patient: Bianca Park  Procedure(s) Performed: COLONOSCOPY WITH PROPOFOL POLYPECTOMY  Patient location during evaluation: Endoscopy Anesthesia Type: General Level of consciousness: awake and alert Pain management: pain level controlled Vital Signs Assessment: post-procedure vital signs reviewed and stable Respiratory status: spontaneous breathing, nonlabored ventilation, respiratory function stable and patient connected to nasal cannula oxygen Cardiovascular status: blood pressure returned to baseline and stable Postop Assessment: no apparent nausea or vomiting Anesthetic complications: no   No notable events documented.   Last Vitals:  Vitals:   10/30/23 1315 10/30/23 1325  BP: 112/85 124/85  Pulse: 69 65  Resp: 19 12  Temp:    SpO2: 100% 100%    Last Pain:  Vitals:   10/30/23 1325  TempSrc:   PainSc: 0-No pain                 Lenard Simmer

## 2023-10-30 NOTE — Interval H&P Note (Signed)
 History and Physical Interval Note: Preprocedure H&P from 10/30/23  was reviewed and there was no interval change after seeing and examining the patient.  Written consent was obtained from the patient after discussion of risks, benefits, and alternatives. Patient has consented to proceed with Colonoscopy with possible intervention   10/30/2023 12:36 PM  Bianca Park  has presented today for surgery, with the diagnosis of Z85.048 (ICD-10-CM) - History of rectal cancer.  The various methods of treatment have been discussed with the patient and family. After consideration of risks, benefits and other options for treatment, the patient has consented to  Procedure(s): COLONOSCOPY WITH PROPOFOL (N/A) as a surgical intervention.  The patient's history has been reviewed, patient examined, no change in status, stable for surgery.  I have reviewed the patient's chart and labs.  Questions were answered to the patient's satisfaction.     Jaynie Collins

## 2023-10-30 NOTE — Transfer of Care (Signed)
 Immediate Anesthesia Transfer of Care Note  Patient: Bianca Park  Procedure(s) Performed: COLONOSCOPY WITH PROPOFOL POLYPECTOMY  Patient Location: PACU  Anesthesia Type:General  Level of Consciousness: drowsy and patient cooperative  Airway & Oxygen Therapy: Patient Spontanous Breathing  Post-op Assessment: Report given to RN and Post -op Vital signs reviewed and stable  Post vital signs: stable  Last Vitals:  Vitals Value Taken Time  BP 104/63 10/30/23 1305  Temp    Pulse    Resp 13 10/30/23 1305  SpO2    Vitals shown include unfiled device data.  Last Pain:  Vitals:   10/30/23 1215  TempSrc: Temporal  PainSc: 0-No pain         Complications: No notable events documented.

## 2023-10-30 NOTE — Anesthesia Preprocedure Evaluation (Signed)
 Anesthesia Evaluation  Patient identified by MRN, date of birth, ID band Patient awake    Reviewed: Allergy & Precautions, NPO status , Patient's Chart, lab work & pertinent test results  History of Anesthesia Complications Negative for: history of anesthetic complications  Airway Mallampati: III  TM Distance: >3 FB Neck ROM: full    Dental  (+) Chipped, Dental Advidsory Given   Pulmonary neg pulmonary ROS, neg shortness of breath   Pulmonary exam normal        Cardiovascular Exercise Tolerance: Good (-) angina negative cardio ROS Normal cardiovascular exam     Neuro/Psych negative neurological ROS  negative psych ROS   GI/Hepatic negative GI ROS, Neg liver ROS,neg GERD  ,,  Endo/Other  negative endocrine ROS    Renal/GU negative Renal ROS  negative genitourinary   Musculoskeletal   Abdominal   Peds  Hematology negative hematology ROS (+)   Anesthesia Other Findings Past Medical History: 02/2020: Cancer (HCC)     Comment:  rectal cancer  Past Surgical History: No date: ABDOMINAL HYSTERECTOMY No date: CESAREAN SECTION 10/17/2020: laparoscopic abdominoperineal proctectomy complete with  colostomy  BMI    Body Mass Index: 34.96 kg/m      Reproductive/Obstetrics negative OB ROS                             Anesthesia Physical Anesthesia Plan  ASA: 2  Anesthesia Plan: General   Post-op Pain Management:    Induction: Intravenous  PONV Risk Score and Plan: 3 and Propofol infusion, TIVA and Treatment may vary due to age or medical condition  Airway Management Planned: Natural Airway and Nasal Cannula  Additional Equipment:   Intra-op Plan:   Post-operative Plan:   Informed Consent: I have reviewed the patients History and Physical, chart, labs and discussed the procedure including the risks, benefits and alternatives for the proposed anesthesia with the patient or  authorized representative who has indicated his/her understanding and acceptance.     Dental Advisory Given  Plan Discussed with: Anesthesiologist, CRNA and Surgeon  Anesthesia Plan Comments: (Patient consented for risks of anesthesia including but not limited to:  - adverse reactions to medications - risk of airway placement if required - damage to eyes, teeth, lips or other oral mucosa - nerve damage due to positioning  - sore throat or hoarseness - Damage to heart, brain, nerves, lungs, other parts of body or loss of life  Patient voiced understanding.)        Anesthesia Quick Evaluation

## 2023-10-31 ENCOUNTER — Encounter: Payer: Self-pay | Admitting: Gastroenterology

## 2023-10-31 LAB — SURGICAL PATHOLOGY

## 2023-12-17 ENCOUNTER — Other Ambulatory Visit: Payer: Self-pay

## 2023-12-17 DIAGNOSIS — Z1231 Encounter for screening mammogram for malignant neoplasm of breast: Secondary | ICD-10-CM

## 2024-01-23 ENCOUNTER — Ambulatory Visit
Admission: RE | Admit: 2024-01-23 | Discharge: 2024-01-23 | Disposition: A | Discharge status: Home / Self Care | Source: Ambulatory Visit

## 2024-01-23 DIAGNOSIS — Z1231 Encounter for screening mammogram for malignant neoplasm of breast: Secondary | ICD-10-CM | POA: Diagnosis present
